# Patient Record
Sex: Female | Born: 1995 | Race: White | Marital: Single | Smoking: Never smoker
Health system: Southern US, Community
[De-identification: ages and names within clinical notes are randomized; demographics above are authoritative.]

---

## 2015-05-01 ENCOUNTER — Emergency Department: Payer: BLUE CROSS/BLUE SHIELD

## 2015-05-01 ENCOUNTER — Encounter: Payer: Self-pay | Admitting: Emergency Medicine

## 2015-05-01 ENCOUNTER — Emergency Department
Admission: EM | Admit: 2015-05-01 | Discharge: 2015-05-01 | Disposition: A | Discharge status: Home / Self Care | Payer: BLUE CROSS/BLUE SHIELD | Attending: Emergency Medicine | Admitting: Emergency Medicine

## 2015-05-01 DIAGNOSIS — X501XXA Overexertion from prolonged static or awkward postures, initial encounter: Secondary | ICD-10-CM | POA: Insufficient documentation

## 2015-05-01 DIAGNOSIS — S93402A Sprain of unspecified ligament of left ankle, initial encounter: Secondary | ICD-10-CM | POA: Insufficient documentation

## 2015-05-01 DIAGNOSIS — Y998 Other external cause status: Secondary | ICD-10-CM | POA: Diagnosis not present

## 2015-05-01 DIAGNOSIS — Y9289 Other specified places as the place of occurrence of the external cause: Secondary | ICD-10-CM | POA: Insufficient documentation

## 2015-05-01 DIAGNOSIS — Y9389 Activity, other specified: Secondary | ICD-10-CM | POA: Insufficient documentation

## 2015-05-01 DIAGNOSIS — S99912A Unspecified injury of left ankle, initial encounter: Secondary | ICD-10-CM | POA: Diagnosis present

## 2015-05-01 MED ORDER — HYDROCODONE-ACETAMINOPHEN 5-325 MG PO TABS: 1.0000 | ORAL_TABLET | ORAL | Status: AC | PRN

## 2015-05-01 MED ORDER — IBUPROFEN 800 MG PO TABS
800.0000 mg | ORAL_TABLET | Freq: Once | ORAL | Status: AC
  Administered 2015-05-01: 800 mg via ORAL
  Filled 2015-05-01: qty 1

## 2015-05-01 MED ORDER — HYDROCODONE-ACETAMINOPHEN 5-325 MG PO TABS
1.0000 | ORAL_TABLET | Freq: Once | ORAL | Status: AC
  Administered 2015-05-01: 1 via ORAL
  Filled 2015-05-01: qty 1

## 2015-05-01 MED ORDER — IBUPROFEN 800 MG PO TABS: 800.0000 mg | ORAL_TABLET | Freq: Three times a day (TID) | ORAL | Status: AC | PRN

## 2015-05-01 NOTE — ED Notes (Signed)
Pt was walking and twisted left ankle. Swelling noted.  Also hurt this ankle one month ago.

## 2015-05-01 NOTE — ED Notes (Signed)
Discussed discharge instructions, prescriptions, and follow-up care with patient. No questions or concerns at this time. Pt stable at discharge.  

## 2015-05-01 NOTE — ED Provider Notes (Signed)
[facility] Emergency Department Provider Note  ____________________________________________  Time seen: Approximately 7:51 PM  I have reviewed the triage vital signs and the nursing notes.   HISTORY  Chief Complaint Ankle Pain    HPI Krystal Hernandez is a 19 y.o. female presents for evaluation of left ankle pain. Patient reports that she twisted this ankle prior to arrival going down some steps. States that she injured this ankle approximately one month ago.   History reviewed. No pertinent past medical history.  There are no active problems to display for this patient.   History reviewed. No pertinent past surgical history.  Current Outpatient Rx  Name  Route  Sig  Dispense  Refill  . HYDROcodone-acetaminophen (NORCO) 5-325 MG tablet   Oral   Take 1-2 tablets by mouth every 4 (four) hours as needed for moderate pain.   10 tablet   0   . ibuprofen (ADVIL,MOTRIN) 800 MG tablet   Oral   Take 1 tablet (800 mg total) by mouth every 8 (eight) hours as needed.   30 tablet   0     Allergies Review of patient's allergies indicates no known allergies.  History reviewed. No pertinent family history.  Social History Social History  Substance Use Topics  . Smoking status: Never Smoker   . Smokeless tobacco: None  . Alcohol Use: No    Review of Systems Constitutional: No fever/chills Eyes: No visual changes. ENT: No sore throat. Cardiovascular: Denies chest pain. Respiratory: Denies shortness of breath. Gastrointestinal: No abdominal pain.  No nausea, no vomiting.  No diarrhea.  No constipation. Genitourinary: Negative for dysuria. Musculoskeletal: Positive for left ankle pain Skin: Negative for rash. Neurological: Negative for headaches, focal weakness or numbness.  10-point ROS otherwise negative.  ____________________________________________   PHYSICAL EXAM:  VITAL SIGNS: ED Triage Vitals  Enc Vitals Group     BP 05/01/15 1903  115/69 mmHg     Pulse Rate 05/01/15 1903 72     Resp 05/01/15 1903 16     Temp 05/01/15 1903 98 F (36.7 C)     Temp Source 05/01/15 1903 Oral     SpO2 05/01/15 1903 98 %     Weight 05/01/15 1902 130 lb (58.968 kg)     Height 05/01/15 1902  (1.702 m)     Head Cir --      Peak Flow --      Pain Score 05/01/15 1902 8     Pain Loc --      Pain Edu? --      Excl. in GC? --     Constitutional: Alert and oriented. Well appearing and in no acute distress.  Cardiovascular: Normal rate, regular rhythm. Grossly normal heart sounds.  Good peripheral circulation. Respiratory: Normal respiratory effort.  No retractions. Lungs CTAB. Musculoskeletal: Left ankle with positive lateral malleolus edema noted. No active bruising. Distally neurovascularly intact. Limited range of motion of the ankle joint itself. Neurologic:  Normal speech and language. No gross focal neurologic deficits are appreciated. No gait instability. Skin:  Skin is warm, dry and intact. No rash noted. Psychiatric: Mood and affect are normal. Speech and behavior are normal.  ____________________________________________   LABS (all labs ordered are listed, but only abnormal results are displayed)  Labs Reviewed - No data to display ____________________________________________   RADIOLOGY  Findings consistent with acute lateral malleolus ankle sprain, positive soft tissue swelling noted. ____________________________________________   PROCEDURES  Procedure(s) performed: None  Critical Care performed: No  ____________________________________________   INITIAL IMPRESSION / ASSESSMENT AND PLAN / ED COURSE  Pertinent labs & imaging results that were available during my care of the patient were reviewed by me and considered in my medical decision making (see chart for details).  She left ankle sprain. Rx given for Norco 5/325 and ibuprofen 800. Crutches and ankle stirrup provided while patient was in ED. Patient  follow-up with PCP or Dr. Hyacinth MeekerMiller as needed for continued ankle pain. Patient voices no other emergency medical complaints at this time. ____________________________________________   FINAL CLINICAL IMPRESSION(S) / ED DIAGNOSES  Final diagnoses:  Ankle sprain, left, initial encounter      Evangeline DakinCharles M Beers, PA-C 05/01/15 2036  Jennye MoccasinBrian S Quigley, MD 05/01/15 2135

## 2015-05-01 NOTE — Discharge Instructions (Signed)
Ankle Sprain °An ankle sprain is an injury to the strong, fibrous tissues (ligaments) that hold the bones of your ankle joint together.  °CAUSES °An ankle sprain is usually caused by a fall or by twisting your ankle. Ankle sprains most commonly occur when you step on the outer edge of your foot, and your ankle turns inward. People who participate in sports are more prone to these types of injuries.  °SYMPTOMS  °· Pain in your ankle. The pain may be present at rest or only when you are trying to stand or walk. °· Swelling. °· Bruising. Bruising may develop immediately or within 1 to 2 days after your injury. °· Difficulty standing or walking, particularly when turning corners or changing directions. °DIAGNOSIS  °Your caregiver will ask you details about your injury and perform a physical exam of your ankle to determine if you have an ankle sprain. During the physical exam, your caregiver will press on and apply pressure to specific areas of your foot and ankle. Your caregiver will try to move your ankle in certain ways. An X-ray exam may be done to be sure a bone was not broken or a ligament did not separate from one of the bones in your ankle (avulsion fracture).  °TREATMENT  °Certain types of braces can help stabilize your ankle. Your caregiver can make a recommendation for this. Your caregiver may recommend the use of medicine for pain. If your sprain is severe, your caregiver may refer you to a surgeon who helps to restore function to parts of your skeletal system (orthopedist) or a physical therapist. °HOME CARE INSTRUCTIONS  °· Apply ice to your injury for 1-2 days or as directed by your caregiver. Applying ice helps to reduce inflammation and pain. °· Put ice in a plastic bag. °· Place a towel between your skin and the bag. °· Leave the ice on for 15-20 minutes at a time, every 2 hours while you are awake. °· Only take over-the-counter or prescription medicines for pain, discomfort, or fever as directed by  your caregiver. °· Elevate your injured ankle above the level of your heart as much as possible for 2-3 days. °· If your caregiver recommends crutches, use them as instructed. Gradually put weight on the affected ankle. Continue to use crutches or a cane until you can walk without feeling pain in your ankle. °· If you have a plaster splint, wear the splint as directed by your caregiver. Do not rest it on anything harder than a pillow for the first 24 hours. Do not put weight on it. Do not get it wet. You may take it off to take a shower or bath. °· You may have been given an elastic bandage to wear around your ankle to provide support. If the elastic bandage is too tight (you have numbness or tingling in your foot or your foot becomes cold and blue), adjust the bandage to make it comfortable. °· If you have an air splint, you may blow more air into it or let air out to make it more comfortable. You may take your splint off at night and before taking a shower or bath. Wiggle your toes in the splint several times per day to decrease swelling. °SEEK MEDICAL CARE IF:  °· You have rapidly increasing bruising or swelling. °· Your toes feel extremely cold or you lose feeling in your foot. °· Your pain is not relieved with medicine. °SEEK IMMEDIATE MEDICAL CARE IF: °· Your toes are numb or blue. °·   You have severe pain that is increasing. MAKE SURE YOU:   Understand these instructions.  Will watch your condition.  Will get help right away if you are not doing well or get worse.   This information is not intended to replace advice given to you by your health care provider. Make sure you discuss any questions you have with your health care provider.   Document Released: 05/07/2005 Document Revised: 05/28/2014 Document Reviewed: 05/19/2011 Elsevier Interactive Patient Education 2016 [employer]  Stirrup Ankle Brace Stirrup ankle braces give support and help stabilize the ankle joint. They are rigid pieces of  plastic or fiberglass that go up both sides of the lower leg with the bottom of the stirrup fitting comfortably under the bottom of the instep of the foot. It can be held on with Velcro straps or an elastic wrap. Stirrup ankle braces are used to support the ankle following mild or moderate sprains or strains, or fractures after cast removal.  They can be easily removed or adjusted if there is swelling. The rigid brace shells are designed to fit the ankle comfortably and provide the needed medial/lateral stabilization. This brace can be easily worn with most athletic shoes. The brace liner is usually made of a soft, comfortable gel-like material. This gel fits the ankle well without causing uncomfortable pressure points.  IMPORTANCE OF ANKLE BRACES:  The use of ankle bracing is effective in the prevention of ankle sprains.  In athletes, the use of ankle bracing will offer protection and prevent further sprains.  Research shows that a complete rehabilitation program needs to be included with external bracing. This includes range of motion and ankle strengthening exercises. Your caregivers will instruct you in this. If you were given the brace today for a new injury, use the following home care instructions as a guide. HOME CARE INSTRUCTIONS   Apply ice to the sore area for 15-20 minutes, 03-04 times per day while awake for the first 2 days. Put the ice in a plastic bag and place a towel between the bag of ice and your skin. Never place the ice pack directly on your skin. Be especially careful using ice on an elbow or knee or other bony area, such as your ankle, because icing for too long may damage the nerves which are close to the surface.  Keep your leg elevated when possible to lessen swelling.  Wear your splint until you are seen for a follow-up examination. Do not put weight on it. Do not get it wet. You may take it off to take a shower or bath.  For Activity: Use crutches with non-weight  bearing for 1 week. Then, you may walk on your ankle as instructed. Start gradually with weight bearing on the affected ankle.  Continue to use crutches or a cane until you can stand on your ankle without causing pain.  Wiggle your toes in the splint several times per day if you are able.  The splint is too tight if you have numbness, tingling, or if your foot becomes cold and blue. Adjust the straps or elastic bandage to make it comfortable.  Only take over-the-counter or prescription medicines for pain, discomfort, or fever as directed by your caregiver. SEEK IMMEDIATE MEDICAL CARE IF:   You have increased bruising, swelling or pain.  Your toes are blue or cold and loosening the brace or wrap does not help.  Your pain is not relieved with medicine. MAKE SURE YOU:   Understand these instructions.  Will watch your condition. °· Will get help right away if you are not doing well or get worse. °  °This information is not intended to replace advice given to you by your health care provider. Make sure you discuss any questions you have with your health care provider. °  °Document Released: 03/07/2004 Document Revised: 07/30/2011 Document Reviewed: 12/07/2014 °Elsevier Interactive Patient Education ©2016 [employer] ° °

## 2017-04-04 IMAGING — CR DG ANKLE COMPLETE 3+V*L*
1 series · 3 of 3 positions shown · non-contrast
Comparison: None.

CLINICAL DATA: Lateral pain and swelling left ankle after twisting
it going down steps today

EXAM:
LEFT ANKLE COMPLETE - 3+ VIEW

[Series 1: dg ankle complete left · 0.14mm/px · 3 of 3 slices shown]
[im 1/3]
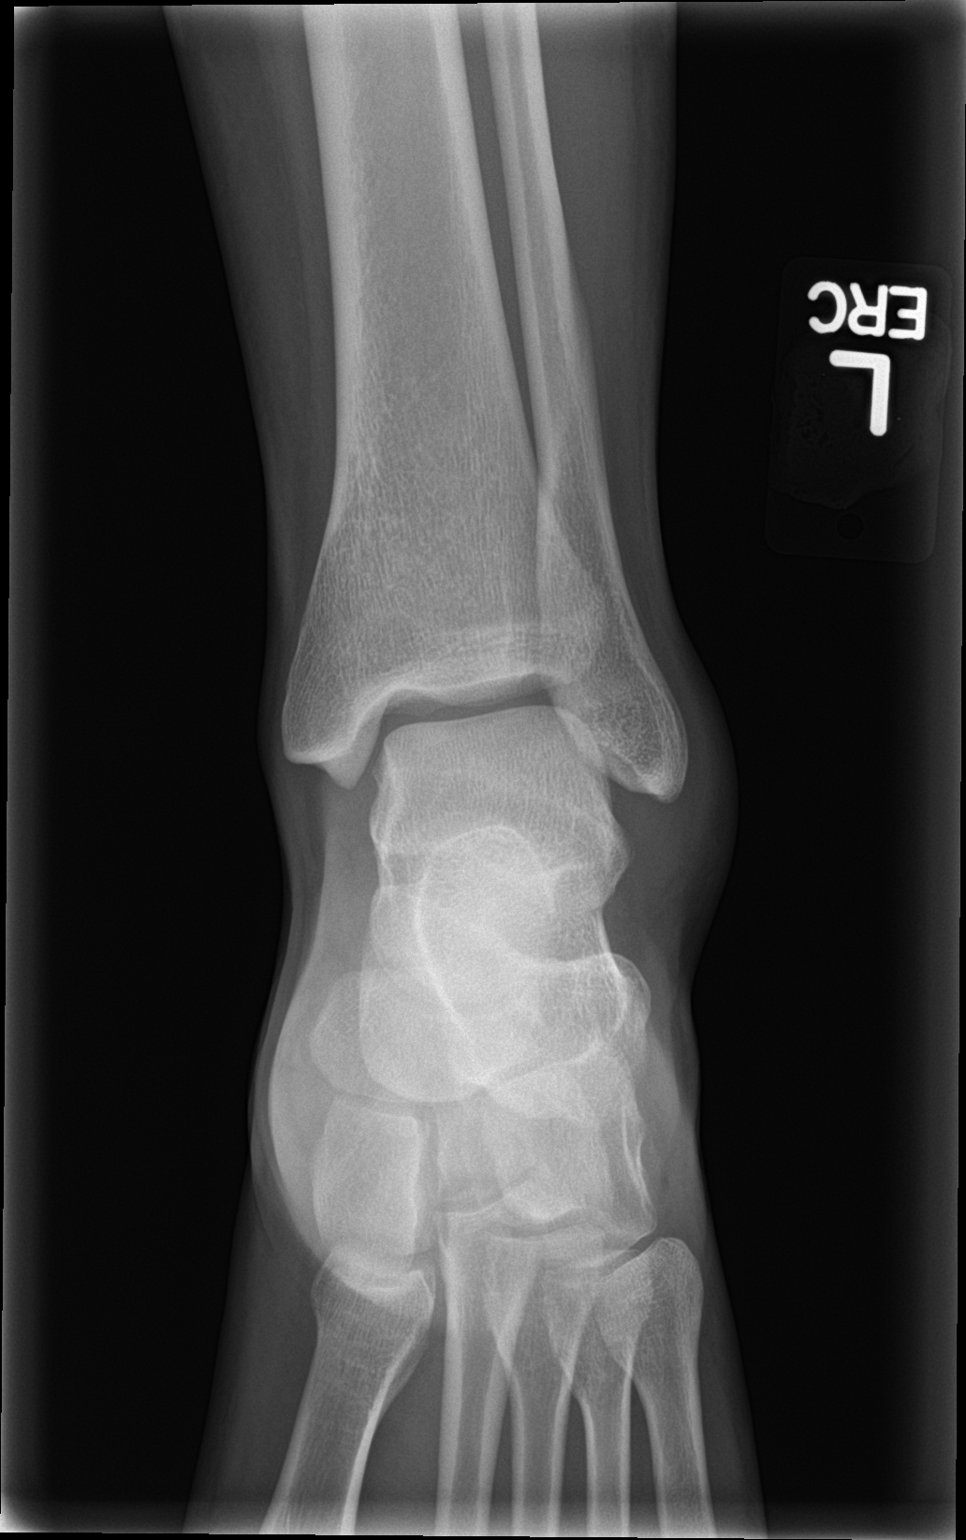
[im 2/3]
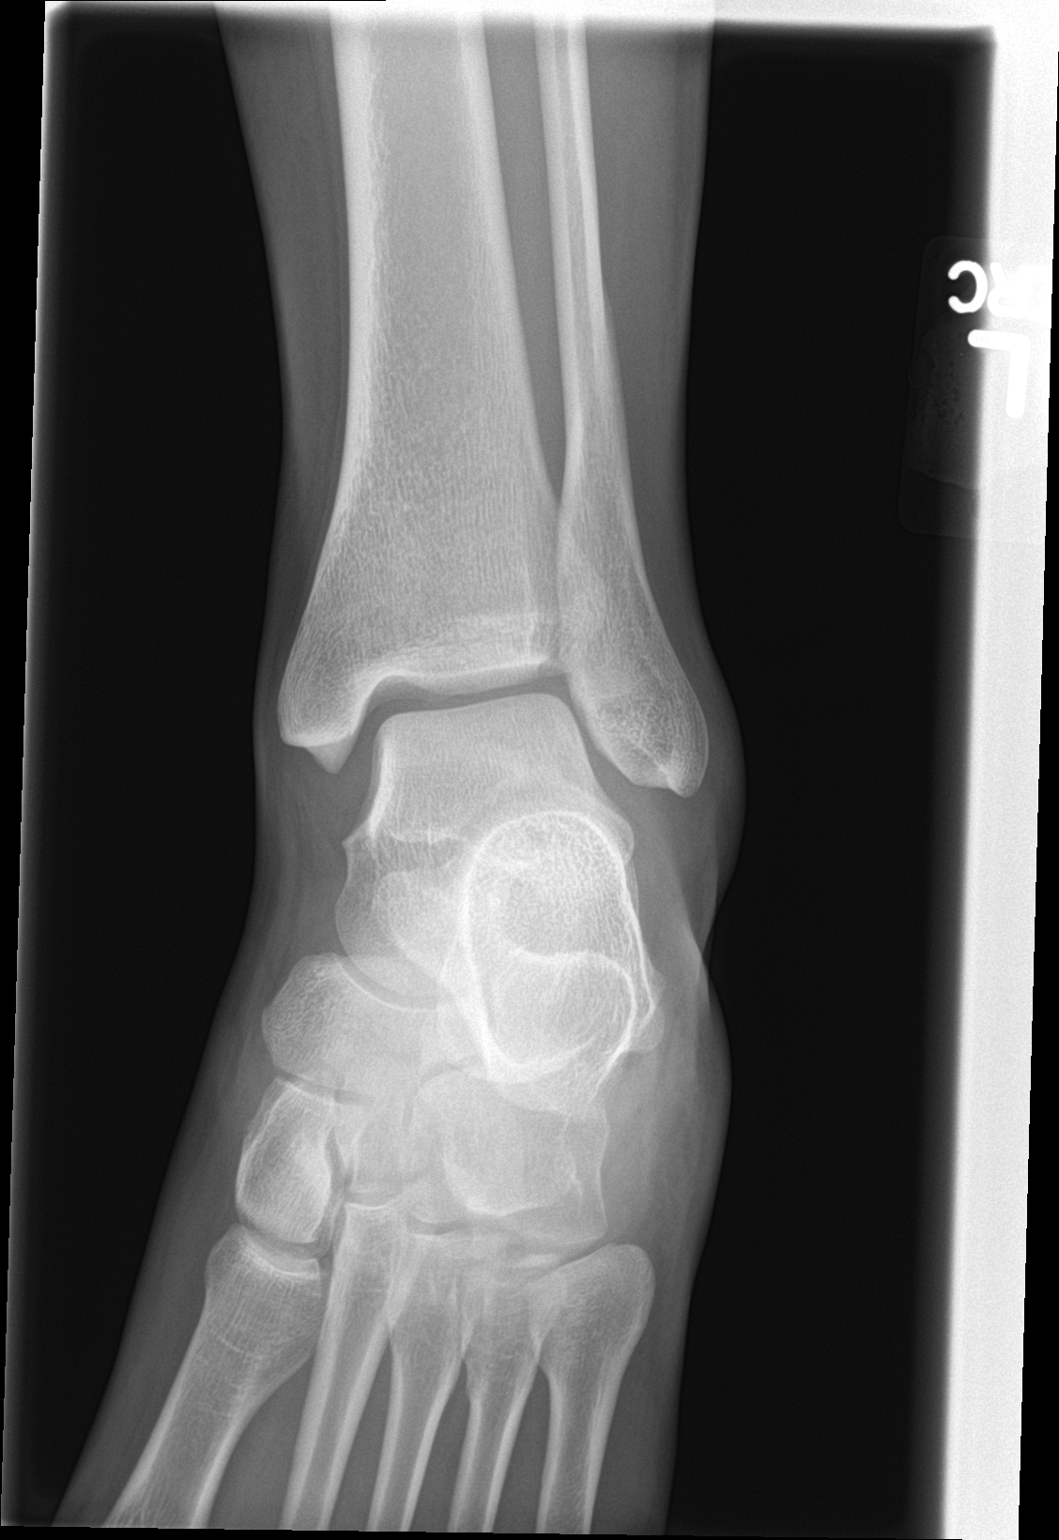
[im 3/3]
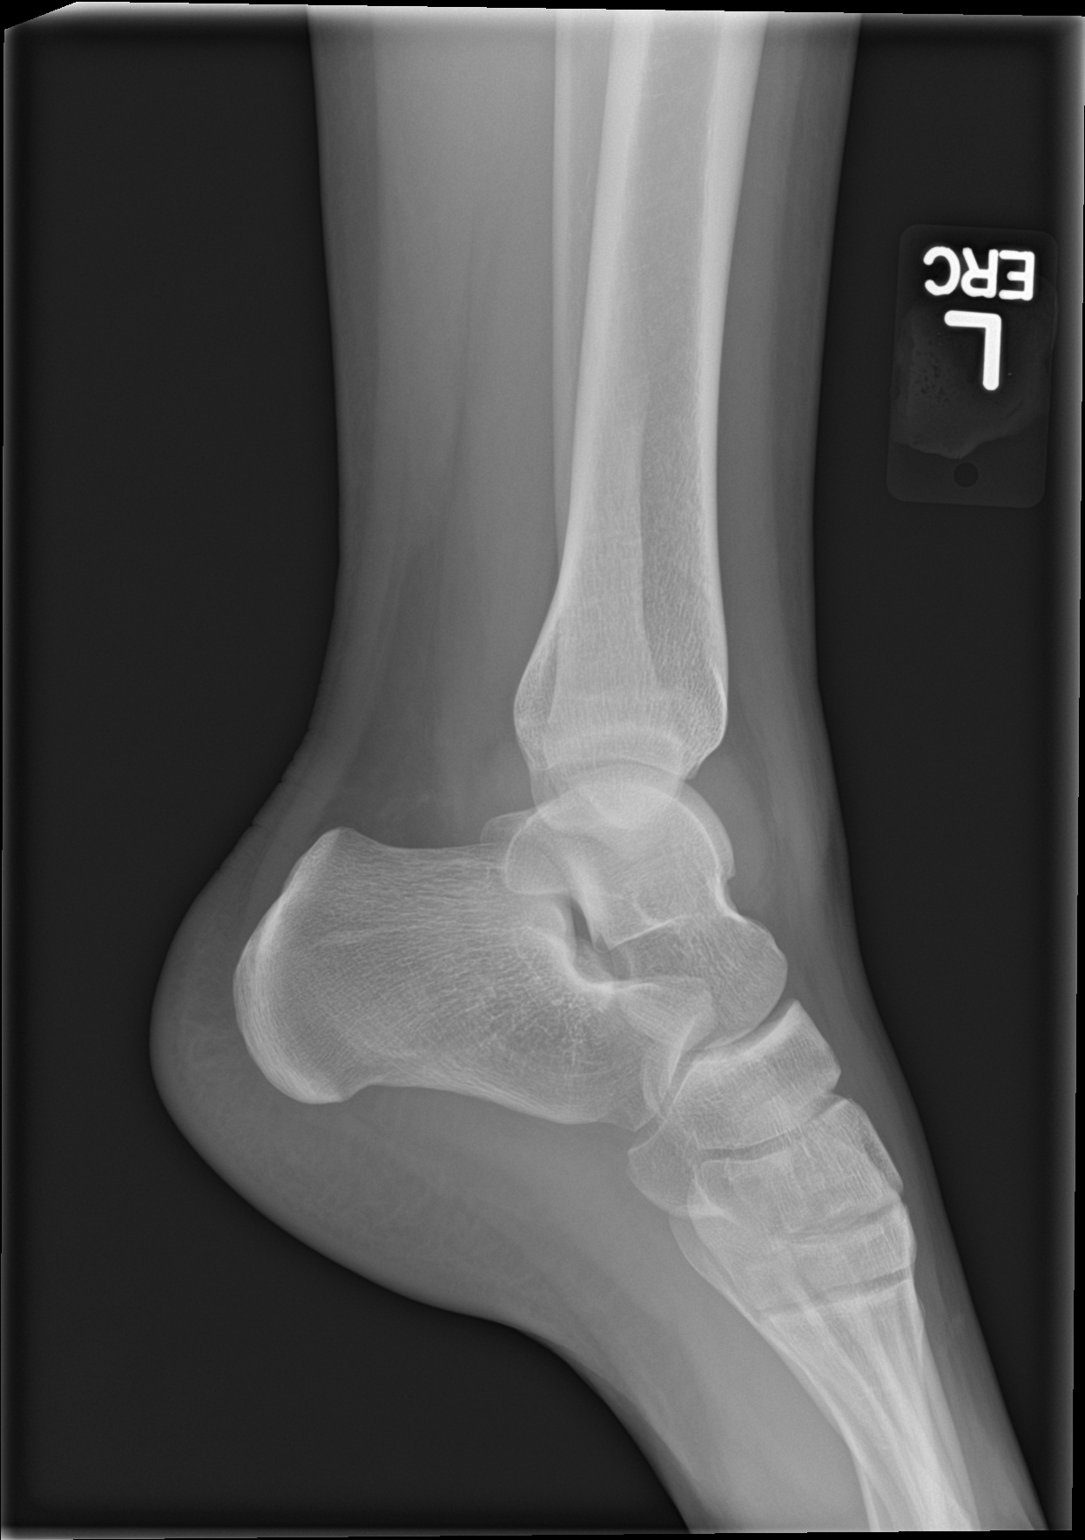

[3 of 3 positions shown; findings below may reference images not displayed]

FINDINGS: Mild soft tissue swelling over the lateral malleolus. The mortise is
intact. There is no fracture or dislocation. There is a moderate
joint effusion.
IMPRESSION: Findings consistent with sprain
